# Patient Record
Sex: Female | Born: 1985 | Race: Black or African American | Hispanic: No | Marital: Single | Smoking: Never smoker
Health system: Southern US, Community
[De-identification: ages and names within clinical notes are randomized; demographics above are authoritative.]

## PROBLEM LIST (undated history)

## (undated) DIAGNOSIS — B019 Varicella without complication: Secondary | ICD-10-CM

## (undated) HISTORY — DX: Varicella without complication: B01.9

---

## 2009-03-29 ENCOUNTER — Ambulatory Visit: Payer: Self-pay | Admitting: Family

## 2009-03-29 ENCOUNTER — Inpatient Hospital Stay (HOSPITAL_COMMUNITY): Admission: AD | Admit: 2009-03-29 | Discharge: 2009-03-29 | Payer: Self-pay | Admitting: Obstetrics & Gynecology

## 2009-05-27 ENCOUNTER — Emergency Department (HOSPITAL_COMMUNITY): Admission: EM | Admit: 2009-05-27 | Discharge: 2009-05-28 | Payer: Self-pay | Admitting: Emergency Medicine

## 2010-12-14 LAB — POCT PREGNANCY, URINE: Preg Test, Ur: NEGATIVE

## 2011-12-25 ENCOUNTER — Encounter (HOSPITAL_COMMUNITY): Payer: Self-pay

## 2011-12-25 ENCOUNTER — Emergency Department (INDEPENDENT_AMBULATORY_CARE_PROVIDER_SITE_OTHER): Payer: Medicaid Other

## 2011-12-25 ENCOUNTER — Emergency Department (INDEPENDENT_AMBULATORY_CARE_PROVIDER_SITE_OTHER)
Admission: EM | Admit: 2011-12-25 | Discharge: 2011-12-25 | Disposition: A | Discharge status: Home / Self Care | Payer: Medicaid Other | Source: Home / Self Care | Attending: Family Medicine | Admitting: Family Medicine

## 2011-12-25 DIAGNOSIS — S92919A Unspecified fracture of unspecified toe(s), initial encounter for closed fracture: Secondary | ICD-10-CM

## 2011-12-25 DIAGNOSIS — S92501A Displaced unspecified fracture of right lesser toe(s), initial encounter for closed fracture: Secondary | ICD-10-CM

## 2011-12-25 MED ORDER — HYDROCODONE-ACETAMINOPHEN 5-500 MG PO TABS: 1.0000 | ORAL_TABLET | Freq: Four times a day (QID) | ORAL | Status: AC | PRN

## 2011-12-25 NOTE — ED Notes (Signed)
Pt states she hit her rt foot on bedpost last pm.  C/o pain in rt 3rd and 4th toes and to top of rt foot.

## 2011-12-25 NOTE — Discharge Instructions (Signed)
Ice and pain medicine for swelling as needed, wear shoe until seen by specialist, call next week for appt.

## 2011-12-25 NOTE — ED Provider Notes (Signed)
History     CSN: [id number]  Arrival date & time 12/25/11  1428   First MD Initiated Contact with Patient 12/25/11 1448      Chief Complaint  Patient presents with  . Foot Injury    (Consider location/radiation/quality/duration/timing/severity/associated sxs/prior treatment) Patient is a 26 y.o. female presenting with foot injury. The history is provided by the patient.  Foot Injury  The incident occurred yesterday. The incident occurred at home. The injury mechanism was a direct blow (sruck foot against bed frame , sts worse today). The pain is present in the right foot. The pain is mild. Associated symptoms include loss of motion.    History reviewed. No pertinent past medical history.  History reviewed. No pertinent past surgical history.  No family history on file.  History  Substance Use Topics  . Smoking status: Never Smoker   . Smokeless tobacco: Not on file  . Alcohol Use: Yes    OB History    Grav Para Term Preterm Abortions TAB SAB Ect Mult Living                  Review of Systems  Allergies  Review of patient's allergies indicates no known allergies.  Home Medications   Current Outpatient Rx  Name Route Sig Dispense Refill  . HYDROCODONE-ACETAMINOPHEN 5-500 MG PO TABS Oral Take 1-2 tablets by mouth every 6 (six) hours as needed for pain. 15 tablet 0    LMP 12/23/2011  Physical Exam  Nursing note and vitals reviewed. Constitutional: She appears well-developed and well-nourished.  Musculoskeletal: She exhibits edema and tenderness.  Skin: Skin is warm and dry.    ED Course  Procedures (including critical care time)  Labs Reviewed - No data to display No results found.   1. Fracture of third toe, right, closed       MDM  X-rays reviewed and report per radiologist.         Linna Hoff, MD 12/30/11 2044

## 2012-02-16 ENCOUNTER — Ambulatory Visit (INDEPENDENT_AMBULATORY_CARE_PROVIDER_SITE_OTHER): Payer: BC Managed Care – PPO | Admitting: Family

## 2012-02-16 ENCOUNTER — Encounter: Payer: Self-pay | Admitting: Family

## 2012-02-16 VITALS — BP 110/80 | HR 86 | Temp 98.7°F | Resp 16 | Ht 63.0 in | Wt 120.0 lb

## 2012-02-16 DIAGNOSIS — Z111 Encounter for screening for respiratory tuberculosis: Secondary | ICD-10-CM

## 2012-02-16 DIAGNOSIS — Z23 Encounter for immunization: Secondary | ICD-10-CM

## 2012-02-16 DIAGNOSIS — Z0000 Encounter for general adult medical examination without abnormal findings: Secondary | ICD-10-CM

## 2012-02-16 NOTE — Progress Notes (Signed)
  Subjective:    Patient ID: Laurie Hampton, female    DOB: 1985-10-31, 26 y.o.   MRN: [id number]  HPI  This is a routine physical examination for this healthy  Female. Reviewed all health maintenance protocols including mammography colonoscopy bone density and reviewed appropriate screening labs. Her immunization history was reviewed as well as her current medications and allergies refills of her chronic medications were given and the plan for yearly health maintenance was discussed all orders and referrals were made as appropriate.   Review of Systems  Constitutional: Negative.   HENT: Negative.   Eyes: Negative.   Respiratory: Negative.   Cardiovascular: Negative.   Gastrointestinal: Negative.   Genitourinary: Negative.   Musculoskeletal: Negative.   Skin: Negative.   Neurological: Negative.   Hematological: Negative.   Psychiatric/Behavioral: Negative.    Past Medical History  Diagnosis Date  . Chickenpox     History   Social History  . Marital Status: Single    Spouse Name: N/A    Number of Children: N/A  . Years of Education: N/A   Occupational History  . Not on file.   Social History Main Topics  . Smoking status: Never Smoker   . Smokeless tobacco: Not on file  . Alcohol Use: Yes  . Drug Use: No  . Sexually Active:    Other Topics Concern  . Not on file   Social History Narrative  . No narrative on file    No past surgical history on file.  Family History  Problem Relation Age of Onset  . Hypertension Mother     No Known Allergies  No current outpatient prescriptions on file prior to visit.    BP 110/80  Pulse 86  Temp(Src) 98.7 F (37.1 C) (Oral)  Resp 16  Ht 5\' 3"  (1.6 m)  Wt 120 lb (54.432 kg)  BMI 21.26 kg/m2  LMP 06/09/2013chart    Objective:   Physical Exam  Constitutional: She is oriented to person, place, and time. She appears well-developed and well-nourished.  HENT:  Head: Normocephalic and atraumatic.  Right Ear:  External ear normal.  Left Ear: External ear normal.  Nose: Nose normal.  Mouth/Throat: Oropharynx is clear and moist.  Eyes: Conjunctivae and EOM are normal. Pupils are equal, round, and reactive to light.  Neck: Normal range of motion. Neck supple. No thyromegaly present.  Cardiovascular: Normal rate, regular rhythm and normal heart sounds.   Pulmonary/Chest: Effort normal and breath sounds normal.  Abdominal: Soft. Bowel sounds are normal.  Musculoskeletal: Normal range of motion.  Neurological: She is alert and oriented to person, place, and time. She has normal reflexes.  Skin: Skin is warm and dry.  Psychiatric: She has a normal mood and affect.          Assessment & Plan:  Assessment: Complete physical exam, need for tdap, need for TB skin test  Plan: TB skin test administered and Tdap. Anticipatory guidance appropriate for age to include safe sex practices, self breast exams, healthy diet and exercise. Hepatitis B titer and varicella titer.

## 2012-02-17 LAB — MEASLES/MUMPS/RUBELLA IMMUNITY
Mumps IgG: 1.17 {ISR} — ABNORMAL HIGH
Rubella: 424.3 IU/mL — ABNORMAL HIGH
Rubeola IgG: 3.07 {ISR} — ABNORMAL HIGH

## 2012-05-04 ENCOUNTER — Telehealth: Payer: Self-pay | Admitting: Family

## 2012-05-04 NOTE — Telephone Encounter (Signed)
Pt scheduled  

## 2012-05-04 NOTE — Telephone Encounter (Signed)
Pls advise if pt was to have these done.

## 2012-05-04 NOTE — Telephone Encounter (Signed)
Varicella and MMR drawn but no Hep B. Stop by and we can draw it.

## 2012-05-04 NOTE — Telephone Encounter (Signed)
Pt thought she received a titers for Hep B. Did not see anything in the chart. Please contact

## 2012-05-06 ENCOUNTER — Other Ambulatory Visit: Payer: Self-pay | Admitting: Family

## 2012-05-06 ENCOUNTER — Other Ambulatory Visit (INDEPENDENT_AMBULATORY_CARE_PROVIDER_SITE_OTHER): Payer: BC Managed Care – PPO

## 2012-05-06 DIAGNOSIS — Z0000 Encounter for general adult medical examination without abnormal findings: Secondary | ICD-10-CM

## 2012-05-06 NOTE — Addendum Note (Signed)
Addended by: Mauri Reading on: 05/06/2012 03:37 PM   Modules accepted: Orders

## 2012-05-07 LAB — HEPATITIS B SURFACE ANTIGEN: Hepatitis B Surface Ag: NEGATIVE

## 2012-05-10 ENCOUNTER — Telehealth: Payer: Self-pay

## 2012-05-10 NOTE — Progress Notes (Signed)
Quick Note:  Called and spoke with pt and pt is aware. Pt request labs be sent to Dr. Malen Gauze [phone] ______

## 2012-05-10 NOTE — Telephone Encounter (Signed)
Called and left a message for pt to make aware that hep b antibody titer had to be added to test and as soon as the results are received pt will be contacted and labs will be faxed to Dr. Malen Gauze at [phone].

## 2012-05-18 ENCOUNTER — Ambulatory Visit (INDEPENDENT_AMBULATORY_CARE_PROVIDER_SITE_OTHER): Payer: BC Managed Care – PPO

## 2012-05-18 DIAGNOSIS — Z23 Encounter for immunization: Secondary | ICD-10-CM

## 2012-05-18 DIAGNOSIS — Z0000 Encounter for general adult medical examination without abnormal findings: Secondary | ICD-10-CM

## 2012-12-06 IMAGING — CR DG FOOT COMPLETE 3+V*R*
3 series · 3 of 3 positions shown · non-contrast
Comparison: None.

CLINICAL DATA: 25-year-old female with blunt trauma.  Pain.

RIGHT FOOT COMPLETE - 3+ VIEW

[view not recorded (1 of 3)]
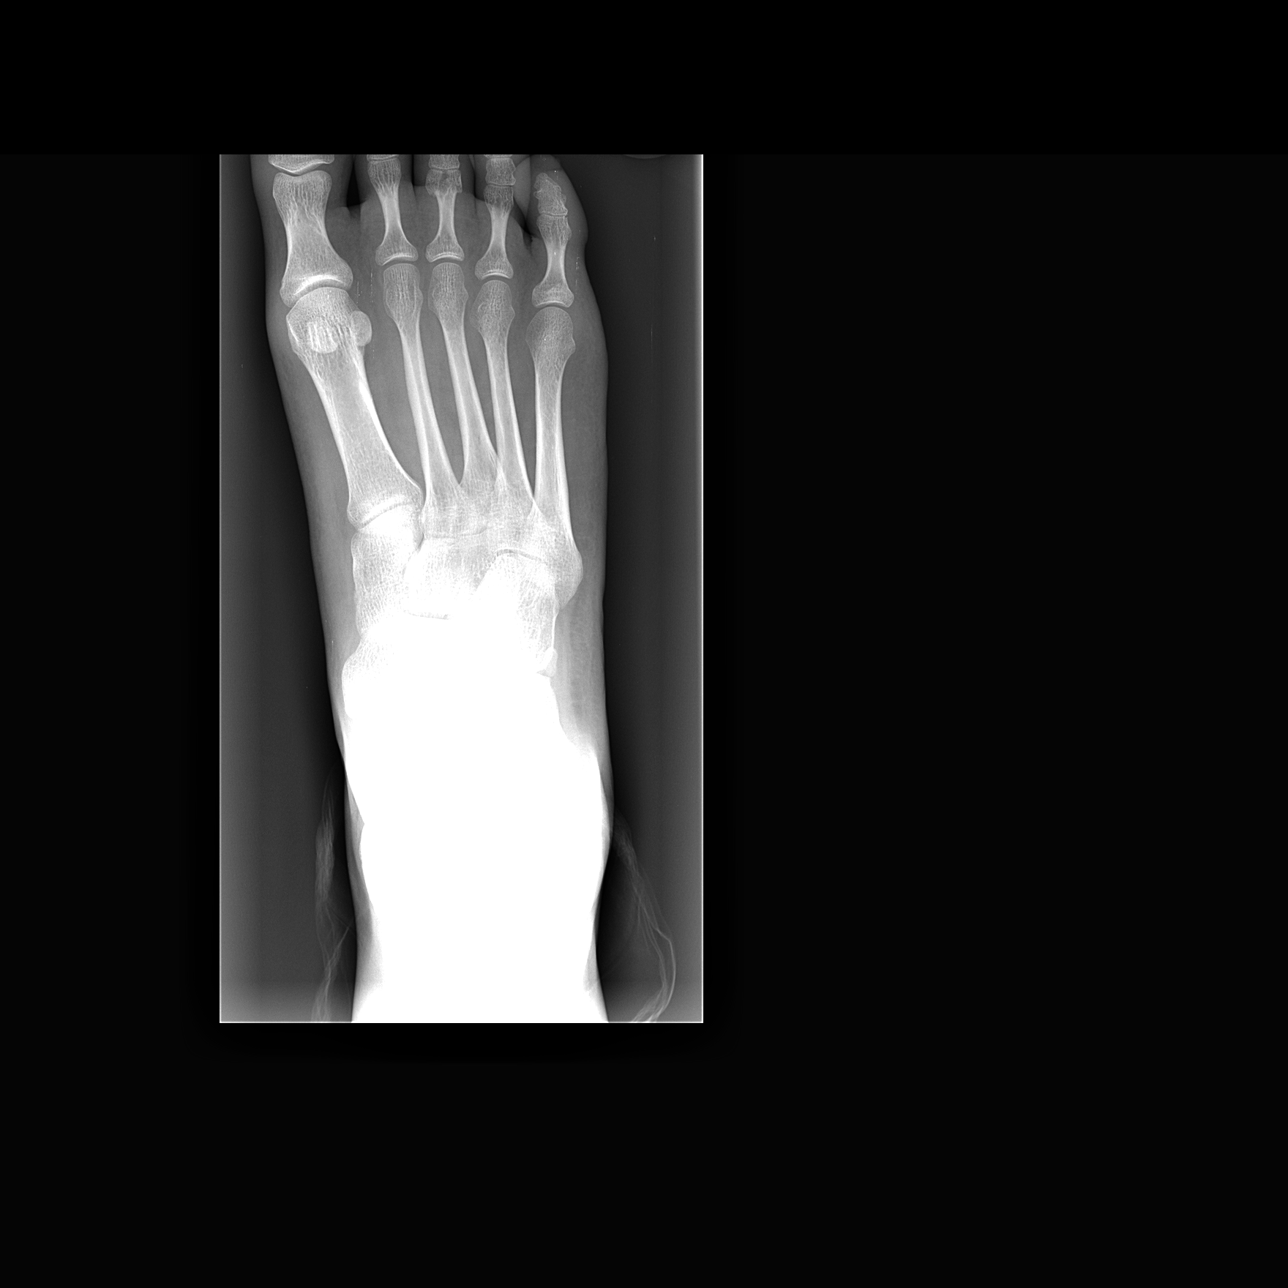

[view not recorded (2 of 3)]
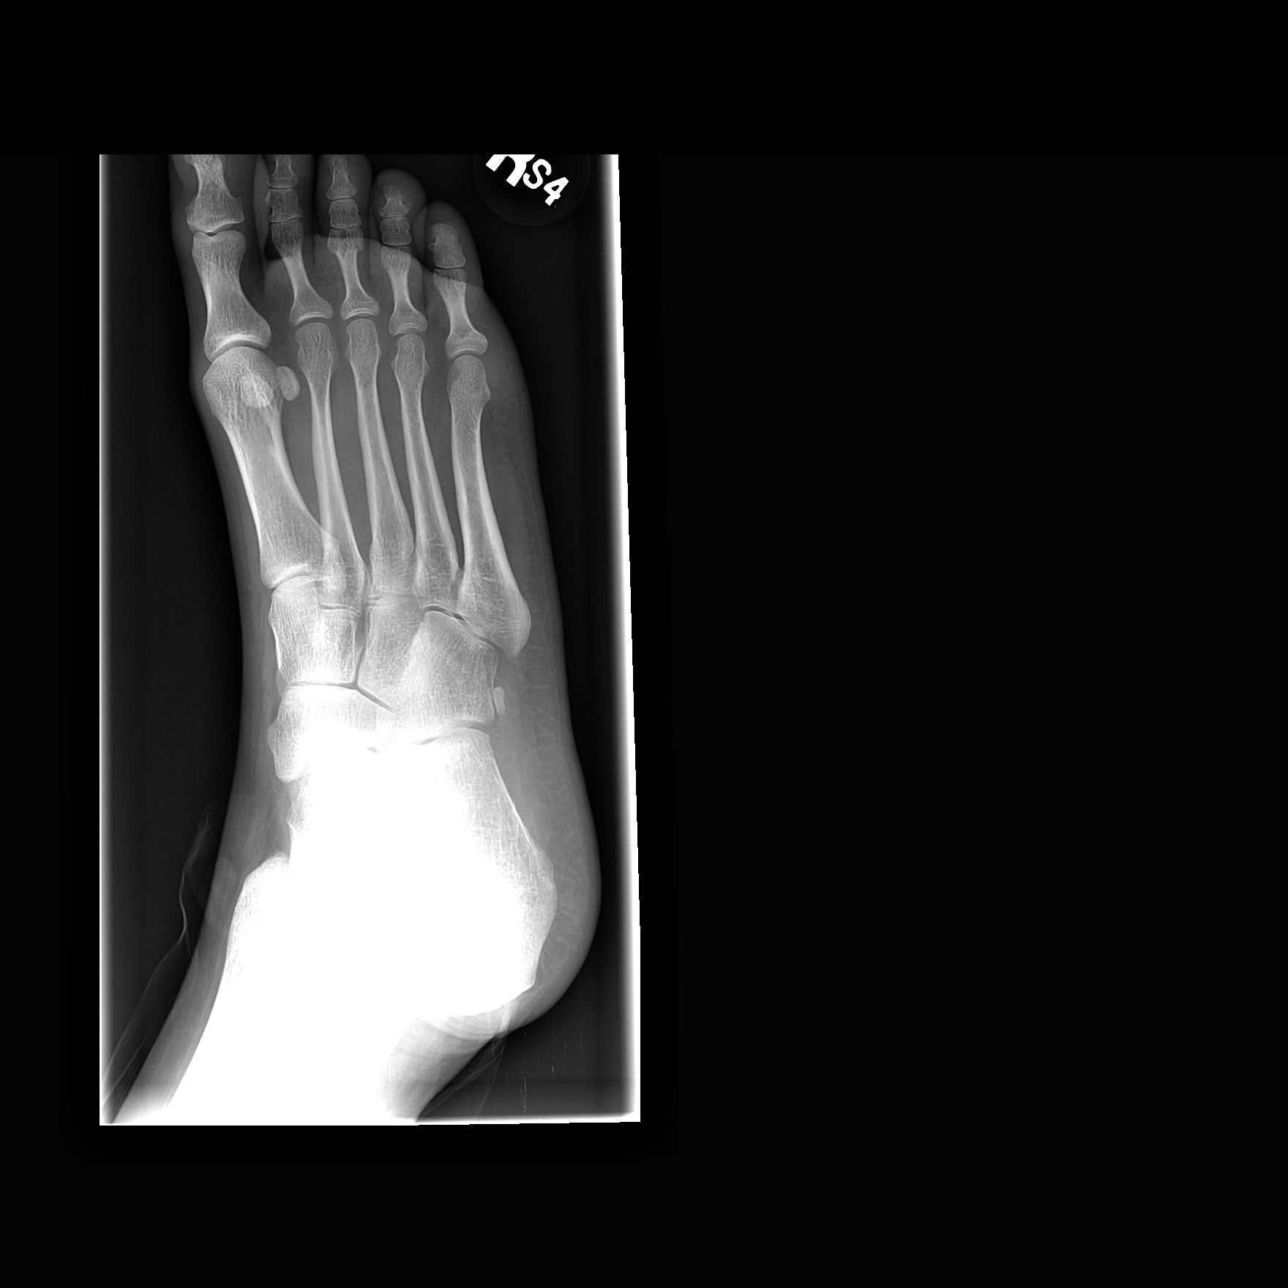

[view not recorded (3 of 3)]
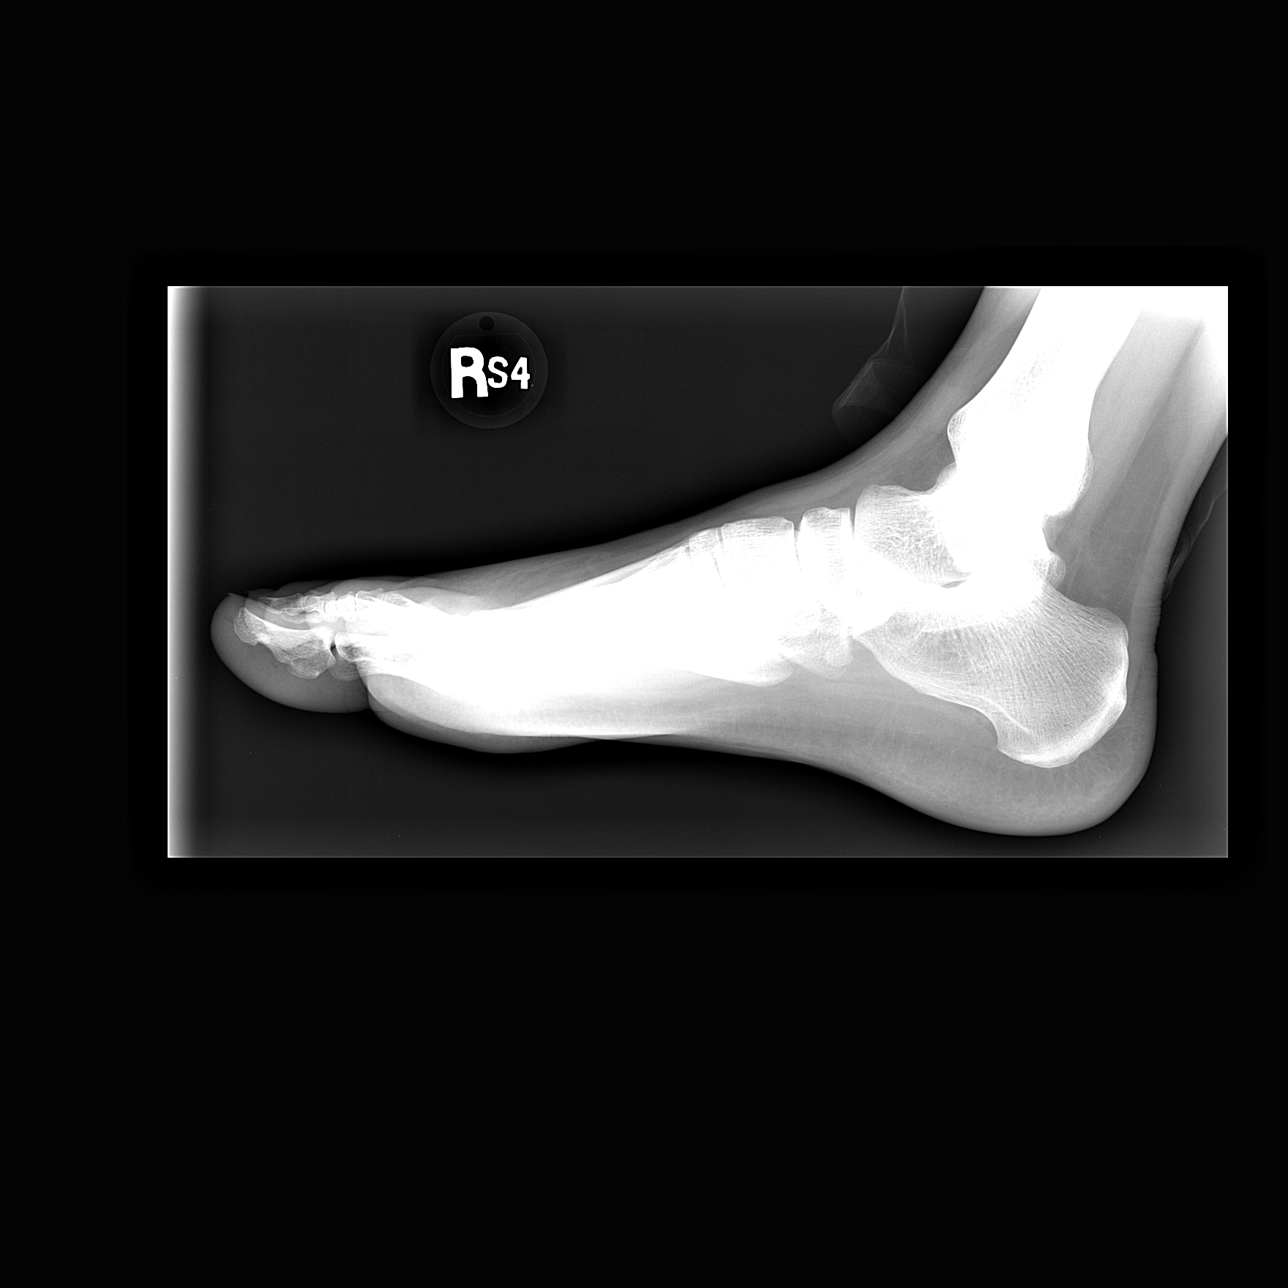

[3 of 3 positions shown; findings below may reference images not displayed]

FINDINGS: Bone mineralization is within normal limits.  Calcaneus
intact.  Joint spaces preserved.  Incidental accessory ossicle
adjacent to the cuboid.

Mildly impacted and comminuted fracture of the right third proximal
phalanx distal aspect.  This does appear to be intra-articular
fracture.  Other phalanges are intact.  No other acute fracture or
dislocation identified in the right foot.
IMPRESSION: Mildly impacted and comminuted intra-articular right third proximal
phalanx fracture.

## 2013-03-13 ENCOUNTER — Ambulatory Visit (INDEPENDENT_AMBULATORY_CARE_PROVIDER_SITE_OTHER): Payer: BC Managed Care – PPO | Admitting: Family

## 2013-03-13 ENCOUNTER — Other Ambulatory Visit (HOSPITAL_COMMUNITY)
Admission: RE | Admit: 2013-03-13 | Discharge: 2013-03-13 | Disposition: A | Discharge status: Home / Self Care | Payer: BC Managed Care – PPO | Source: Ambulatory Visit | Attending: Family | Admitting: Family

## 2013-03-13 ENCOUNTER — Encounter: Payer: Self-pay | Admitting: Family

## 2013-03-13 VITALS — BP 100/58 | HR 100 | Ht 62.75 in | Wt 123.0 lb

## 2013-03-13 DIAGNOSIS — Z113 Encounter for screening for infections with a predominantly sexual mode of transmission: Secondary | ICD-10-CM

## 2013-03-13 DIAGNOSIS — N760 Acute vaginitis: Secondary | ICD-10-CM | POA: Insufficient documentation

## 2013-03-13 DIAGNOSIS — Z01419 Encounter for gynecological examination (general) (routine) without abnormal findings: Secondary | ICD-10-CM | POA: Insufficient documentation

## 2013-03-13 DIAGNOSIS — Z0000 Encounter for general adult medical examination without abnormal findings: Secondary | ICD-10-CM

## 2013-03-13 LAB — POCT URINALYSIS DIPSTICK
Bilirubin, UA: NEGATIVE
Glucose, UA: NEGATIVE
Leukocytes, UA: NEGATIVE
Nitrite, UA: NEGATIVE
pH, UA: 7.5

## 2013-03-13 NOTE — Patient Instructions (Signed)
Bacterial Vaginosis Bacterial vaginosis (BV) is a vaginal infection where the normal balance of bacteria in the vagina is disrupted. The normal balance is then replaced by an overgrowth of certain bacteria. There are several different kinds of bacteria that can cause BV. BV is the most common vaginal infection in women of childbearing age. CAUSES   The cause of BV is not fully understood. BV develops when there is an increase or imbalance of harmful bacteria.  Some activities or behaviors can upset the normal balance of bacteria in the vagina and put women at increased risk including:  Having a new sex partner or multiple sex partners.  Douching.  Using an intrauterine device (IUD) for contraception.  It is not clear what role sexual activity plays in the development of BV. However, women that have never had sexual intercourse are rarely infected with BV. Women do not get BV from toilet seats, bedding, swimming pools or from touching objects around them.  SYMPTOMS   Grey vaginal discharge.  A fish-like odor with discharge, especially after sexual intercourse.  Itching or burning of the vagina and vulva.  Burning or pain with urination.  Some women have no signs or symptoms at all. DIAGNOSIS  Your caregiver must examine the vagina for signs of BV. Your caregiver will perform lab tests and look at the sample of vaginal fluid through a microscope. They will look for bacteria and abnormal cells (clue cells), a pH test higher than 4.5, and a positive amine test all associated with BV.  RISKS AND COMPLICATIONS   Pelvic inflammatory disease (PID).  Infections following gynecology surgery.  Developing HIV.  Developing herpes virus. TREATMENT  Sometimes BV will clear up without treatment. However, all women with symptoms of BV should be treated to avoid complications, especially if gynecology surgery is planned. Female partners generally do not need to be treated. However, BV may spread  between female sex partners so treatment is helpful in preventing a recurrence of BV.   BV may be treated with antibiotics. The antibiotics come in either pill or vaginal cream forms. Either can be used with nonpregnant or pregnant women, but the recommended dosages differ. These antibiotics are not harmful to the baby.  BV can recur after treatment. If this happens, a second round of antibiotics will often be prescribed.  Treatment is important for pregnant women. If not treated, BV can cause a premature delivery, especially for a pregnant woman who had a premature birth in the past. All pregnant women who have symptoms of BV should be checked and treated.  For chronic reoccurrence of BV, treatment with a type of prescribed gel vaginally twice a week is helpful. HOME CARE INSTRUCTIONS   Finish all medication as directed by your caregiver.  Do not have sex until treatment is completed.  Tell your sexual partner that you have a vaginal infection. They should see their caregiver and be treated if they have problems, such as a mild rash or itching.  Practice safe sex. Use condoms. Only have 1 sex partner. PREVENTION  Basic prevention steps can help reduce the risk of upsetting the natural balance of bacteria in the vagina and developing BV:  Do not have sexual intercourse (be abstinent).  Do not douche.  Use all of the medicine prescribed for treatment of BV, even if the signs and symptoms go away.  Tell your sex partner if you have BV. That way, they can be treated, if needed, to prevent reoccurrence. SEEK MEDICAL CARE IF:     Your symptoms are not improving after 3 days of treatment.  You have increased discharge, pain, or fever. MAKE SURE YOU:   Understand these instructions.  Will watch your condition.  Will get help right away if you are not doing well or get worse. FOR MORE INFORMATION  Division of STD Prevention (DSTDP), Centers for Disease Control and Prevention:  [employer] American Social Health Association (ASHA): www.ashastd.org  Document Released: 08/24/2005 Document Revised: 11/16/2011 Document Reviewed: 02/14/2009 [facility] Patient Information 2014 [place], [place].   Breast Self-Examination You should begin examining your breasts at age 61 even though the risk for breast cancer is low in this age group. It is important to become familiar with how your breasts look and feel. This is true for pregnant women, nursing mothers, women in menopause and women who have breast implants.  Women should examine their breasts once a month to look for changes and lumps. By doing monthly breast exams, you get to know how your breasts feel and how they can change from month to month. This allows you to pick up changes early. It can also offer you some reassurance that your breast health is good. This exam only takes minutes. Most breast lumps are not caused by cancer. If you find a lump, a special x-ray called a mammogram, or other tests may be needed to determine what is wrong.  Some of the signs that a breast lump is caused by cancer include:  Dimpling of the skin or changes in the shape of the breast or nipple.   A dark-colored or bloody discharge from the nipple.   Swollen lymph glands around the breast or in the armpit.   Redness of the breast or nipple.   Scaly nipple or skin on the breast.   Pain or swelling of the breast.  SELF-EXAM There are a few points to follow when doing a thorough breast exam. The best time to examine your breasts is 5 to 7 days after the menstrual period is over. During menstruation, the breasts are lumpier, and it may be more difficult to pick up changes. If you do not menstruate, have reached menopause or had a hysterectomy, examine your breasts the first day of every month. After three to four months, you will become more familiar with the variations of your breasts and more comfortable with the exam.  Perform your breast  exam monthly. Keep a written record with breast changes or normal findings for each breast. This makes it easier to be sure of changes and to not solely depend on memory for size, tenderness, or location. Try to do the exam at the same time each month, and write down where you are in your menstrual cycle if you are still menstruating.   Look at your breasts. Stand in front of a mirror with your hands clasped behind your head. Tighten your chest muscles and look for asymmetry. This means a difference in shape or contour from one breast to the other, such as puckers, dips or bumps. Look also for skin changes.   Lean forward with your hands on your hips. Again, look for symmetry and skin changes.   While showering, soap the breasts, and carefully feel the breasts with fingertips while holding the arm (on the side of the breast being examined) over the head. Do this with each breast carefully feeling for lumps or changes. Typically, a circular motion with moderate fingertip pressure should be used.   Repeat this exam while lying on your back, again  with your arm behind your head and a pillow under your shoulders. Again, use your fingertips to examine both breasts, feeling for lumps and thickening. Begin at 1 o'clock and go clockwise around the whole breast.   At the end of your exam, gently squeeze each nipple to see if there is any drainage. Look for nipple changes, dimpling or redness.   Lastly, examine the upper chest and clavicle areas and in your armpits.  It is not necessary to become alarmed if you find a breast lump. Most of them are not cancerous. However, it is necessary to see your caregiver if a lump is found in order to have it looked at. Document Released: 10/01/2004 Document Revised: 05/06/2011 Document Reviewed: 12/11/2008 [facility] Patient Information 2012 [place], [place].

## 2013-03-14 ENCOUNTER — Encounter: Payer: Self-pay | Admitting: Family

## 2013-03-14 LAB — HIV ANTIBODY (ROUTINE TESTING W REFLEX): HIV: NONREACTIVE

## 2013-03-14 LAB — CBC WITH DIFFERENTIAL/PLATELET
Basophils Absolute: 0 K/uL (ref 0.0–0.1)
Basophils Relative: 0.5 % (ref 0.0–3.0)
Eosinophils Absolute: 0.1 K/uL (ref 0.0–0.7)
Eosinophils Relative: 1.8 % (ref 0.0–5.0)
HCT: 40.5 % (ref 36.0–46.0)
Hemoglobin: 13.4 g/dL (ref 12.0–15.0)
Lymphocytes Relative: 36.3 % (ref 12.0–46.0)
Lymphs Abs: 2.3 K/uL (ref 0.7–4.0)
MCHC: 33.2 g/dL (ref 30.0–36.0)
MCV: 86.5 fl (ref 78.0–100.0)
Monocytes Absolute: 0.7 K/uL (ref 0.1–1.0)
Monocytes Relative: 10.4 % (ref 3.0–12.0)
Neutro Abs: 3.2 K/uL (ref 1.4–7.7)
Neutrophils Relative %: 51 % (ref 43.0–77.0)
Platelets: 175 K/uL (ref 150.0–400.0)
RBC: 4.68 Mil/uL (ref 3.87–5.11)
RDW: 13.6 % (ref 11.5–14.6)
WBC: 6.2 K/uL (ref 4.5–10.5)

## 2013-03-14 LAB — SYPHILIS: RPR W/REFLEX TO RPR TITER AND TREPONEMAL ANTIBODIES, TRADITIONAL SCREENING AND DIAGNOSIS ALGORITHM

## 2013-03-14 LAB — COMPREHENSIVE METABOLIC PANEL WITH GFR
ALT: 18 U/L (ref 0–35)
AST: 19 U/L (ref 0–37)
Albumin: 4.5 g/dL (ref 3.5–5.2)
Alkaline Phosphatase: 40 U/L (ref 39–117)
BUN: 10 mg/dL (ref 6–23)
CO2: 31 meq/L (ref 19–32)
Calcium: 9.3 mg/dL (ref 8.4–10.5)
Chloride: 106 meq/L (ref 96–112)
Creatinine, Ser: 0.9 mg/dL (ref 0.4–1.2)
GFR: 102.57 mL/min
Glucose, Bld: 78 mg/dL (ref 70–99)
Potassium: 4.6 meq/L (ref 3.5–5.1)
Sodium: 141 meq/L (ref 135–145)
Total Bilirubin: 0.9 mg/dL (ref 0.3–1.2)
Total Protein: 7.6 g/dL (ref 6.0–8.3)

## 2013-03-14 MED ORDER — METRONIDAZOLE 500 MG PO TABS: 500.0000 mg | ORAL_TABLET | Freq: Two times a day (BID) | ORAL | Status: AC

## 2013-03-14 MED ORDER — LEVONORGESTREL-ETHINYL ESTRAD 0.15-30 MG-MCG PO TABS: 1.0000 | ORAL_TABLET | Freq: Every day | ORAL | Status: AC

## 2013-03-14 NOTE — Progress Notes (Signed)
  Subjective:    Patient ID: Laurie Hampton, female    DOB: 13-Sep-1985, 27 y.o.   MRN: [id number]  HPI 27 year old African American female, nonsmoker is in for complete physical exam with Pap and pelvic. She has concerns of vaginal discharge that she describes it as fishy at times. Reports it's white. No bleeding in between her menstrual cycles. She sexually active with one female partner. Will like to be screened for sexually transmitted diseases. She would also like to consider birth control pills.   Review of Systems  Constitutional: Negative.   HENT: Negative.   Eyes: Negative.   Respiratory: Negative.   Cardiovascular: Negative.   Gastrointestinal: Negative.   Endocrine: Negative.   Genitourinary: Positive for vaginal discharge.  Musculoskeletal: Negative.   Skin: Negative.   Allergic/Immunologic: Negative.   Neurological: Negative.   Hematological: Negative.   Psychiatric/Behavioral: Negative.    Past Medical History  Diagnosis Date  . Chickenpox     History   Social History  . Marital Status: Single    Spouse Name: N/A    Number of Children: N/A  . Years of Education: N/A   Occupational History  . Not on file.   Social History Main Topics  . Smoking status: Never Smoker   . Smokeless tobacco: Not on file  . Alcohol Use: Yes  . Drug Use: No  . Sexually Active:    Other Topics Concern  . Not on file   Social History Narrative  . No narrative on file    History reviewed. No pertinent past surgical history.  Family History  Problem Relation Age of Onset  . Hypertension Mother     No Known Allergies  No current outpatient prescriptions on file prior to visit.   No current facility-administered medications on file prior to visit.    BP 100/58  Pulse 100  Ht 5' 2.75" (1.594 m)  Wt 123 lb (55.792 kg)  BMI 21.96 kg/m2  SpO2 97%chart    Objective:   Physical Exam  Constitutional: She is oriented to person, place, and time. She appears  well-developed and well-nourished.  HENT:  Head: Normocephalic.  Right Ear: External ear normal.  Left Ear: External ear normal.  Nose: Nose normal.  Mouth/Throat: Oropharynx is clear and moist.  Eyes: Conjunctivae are normal. Pupils are equal, round, and reactive to light.  Neck: Normal range of motion. Neck supple.  Cardiovascular: Normal rate, regular rhythm and normal heart sounds.   Pulmonary/Chest: Effort normal and breath sounds normal.  Abdominal: Soft. Bowel sounds are normal.  Genitourinary: Uterus normal. Vaginal discharge found.  White frothy vaginal discharge. Whiff test positive  Musculoskeletal: Normal range of motion.  Neurological: She is alert and oriented to person, place, and time. She has normal reflexes. She displays normal reflexes. No cranial nerve deficit. Coordination normal.  Skin: Skin is warm and dry.  Psychiatric: She has a normal mood and affect.          Assessment & Plan:  Assessment: 1. Complete physical exam 2. Counseling on birth control 3. Vaginal discharge 4. Bacterial vaginosis   Plan: Flagyl 500 mg one tablet twice a day x7 days. Nordette birth control pills once daily. Safe sex practices. Lab sent to include UA, CBC, CMP, HIV, RPR, HSV 1 and 2 were noted patient pending results. Encouraged healthy diet, exercise, monthly self breast exams. We'll follow up patient in one year and sooner as needed.

## 2014-07-09 ENCOUNTER — Encounter: Payer: Self-pay | Admitting: Family

## 2018-11-19 ENCOUNTER — Ambulatory Visit (INDEPENDENT_AMBULATORY_CARE_PROVIDER_SITE_OTHER): Payer: Managed Care, Other (non HMO)

## 2018-11-19 ENCOUNTER — Encounter (HOSPITAL_COMMUNITY): Payer: Self-pay

## 2018-11-19 ENCOUNTER — Ambulatory Visit (HOSPITAL_COMMUNITY)
Admission: EM | Admit: 2018-11-19 | Discharge: 2018-11-19 | Disposition: A | Discharge status: Home / Self Care | Payer: Managed Care, Other (non HMO) | Attending: Emergency Medicine | Admitting: Emergency Medicine

## 2018-11-19 DIAGNOSIS — W2203XA Walked into furniture, initial encounter: Secondary | ICD-10-CM | POA: Diagnosis not present

## 2018-11-19 DIAGNOSIS — S92514A Nondisplaced fracture of proximal phalanx of right lesser toe(s), initial encounter for closed fracture: Secondary | ICD-10-CM | POA: Diagnosis not present

## 2018-11-19 DIAGNOSIS — S99921A Unspecified injury of right foot, initial encounter: Secondary | ICD-10-CM

## 2018-11-19 MED ORDER — NAPROXEN 500 MG PO TABS: 500.0000 mg | ORAL_TABLET | Freq: Two times a day (BID) | ORAL | 0 refills | Status: AC

## 2018-11-19 NOTE — ED Provider Notes (Signed)
[facility]    CSN: [id number] Arrival date & time: 11/19/18  1734     History   Chief Complaint Chief Complaint  Patient presents with  . Foot Injury    Right foot / toe     HPI Laurie Hampton is a 34 y.o. female.   Laurie Hampton presents with complaints of right foot ring toe with pain after she accidentally kicked the corner of her couch today at approximately 1p today. Pain with weight bearing and with touch. Some numbness sensation to the toe. Hasn't taken any medications for symptoms. Pain 7/10. States she has broken a toe on this foot in the past. No broken skin, laceration or nail injury. Without contributing medical history.      ROS per HPI, negative if not otherwise mentioned.      Past Medical History:  Diagnosis Date  . Chickenpox     There are no active problems to display for this patient.   History reviewed. No pertinent surgical history.  OB History    Gravida  1   Para  1   Term      Preterm      AB      Living        SAB      TAB      Ectopic      Multiple      Live Births               Home Medications    Prior to Admission medications   Medication Sig Start Date End Date Taking? Authorizing Provider  levonorgestrel-ethinyl estradiol (NORDETTE) 0.15-30 MG-MCG tablet Take 1 tablet by mouth daily. 03/14/13   Eulis Foster, FNP  metroNIDAZOLE (FLAGYL) 500 MG tablet Take 1 tablet (500 mg total) by mouth 2 (two) times daily. 03/14/13   Eulis Foster, FNP  naproxen (NAPROSYN) 500 MG tablet Take 1 tablet (500 mg total) by mouth 2 (two) times daily. 11/19/18   Georgetta Haber, NP    Family History Family History  Problem Relation Age of Onset  . Hypertension Mother     Social History Social History   Tobacco Use  . Smoking status: Never Smoker  Substance Use Topics  . Alcohol use: Yes  . Drug use: No     Allergies   Patient has no known allergies.   Review of Systems Review of Systems    Physical Exam Triage Vital Signs ED Triage Vitals [11/19/18 1828]  Enc Vitals Group     BP 137/81     Pulse Rate 88     Resp 16     Temp 98.1 F (36.7 C)     Temp Source Oral     SpO2 100 %     Weight      Height      Head Circumference      Peak Flow      Pain Score 7     Pain Loc      Pain Edu?      Excl. in GC?    No data found.  Updated Vital Signs BP 137/81 (BP Location: Right Arm)   Pulse 88   Temp 98.1 F (36.7 C) (Oral)   Resp 16   LMP 11/11/2018 (Exact Date)   SpO2 100%    Physical Exam Constitutional:      General: She is not in acute distress.    Appearance: She is well-developed.  Cardiovascular:  Rate and Rhythm: Normal rate and regular rhythm.     Heart sounds: Normal heart sounds.  Pulmonary:     Effort: Pulmonary effort is normal.     Breath sounds: Normal breath sounds.  Musculoskeletal:     Right ankle: Normal.     Right foot: Normal range of motion and normal capillary refill. Tenderness, bony tenderness and swelling present. No crepitus, deformity or laceration.     Comments: Right ring toe with tenderness at distal and proximal phalanx; no tenderness at MTP joint; cap refill < 2 seconds , gross sensation intact; limited ROM to DIP joint to this toe due to pain; swelling and bruising noted; skin intact; strong dorsal pedal pulse   Skin:    General: Skin is warm and dry.  Neurological:     Mental Status: She is alert and oriented to person, place, and time.      UC Treatments / Results  Labs (all labs ordered are listed, but only abnormal results are displayed) Labs Reviewed - No data to display  EKG None  Radiology Dg Foot Complete Right  Result Date: 11/19/2018 CLINICAL DATA:  Kicked object accidentally.  Pain EXAM: RIGHT FOOT COMPLETE - 3+ VIEW COMPARISON:  None. FINDINGS: There is a fracture noted at the base of the right 4th toe proximal phalanx, minimal displacement. No subluxation or dislocation. No additional fracture.  IMPRESSION: Fracture at the base of the right 4th toe proximal phalanx. Electronically Signed   By: Charlett Nose M.D.   On: 11/19/2018 19:08    Procedures Procedures (including critical care time)  Medications Ordered in UC Medications - No data to display  Initial Impression / Assessment and Plan / UC Course  I have reviewed the triage vital signs and the nursing notes.  Pertinent labs & imaging results that were available during my care of the patient were reviewed by me and considered in my medical decision making (see chart for details).     Fracture identified. Post op shoe provided and educated on pain management. Patient verbalized understanding and agreeable to plan.  Ambulatory out of clinic without difficulty.    Final Clinical Impressions(s) / UC Diagnoses   Final diagnoses:  Closed nondisplaced fracture of proximal phalanx of lesser toe of right foot, initial encounter     Discharge Instructions     Ice, elevation naproxen for pain control.  Use of shoe we have provided for you until pain has resolved.  Follow up with orthopedics as needed for persistent or worsening of symptoms.     ED Prescriptions    Medication Sig Dispense Auth. Provider   naproxen (NAPROSYN) 500 MG tablet Take 1 tablet (500 mg total) by mouth 2 (two) times daily. 30 tablet Georgetta Haber, NP     Controlled Substance Prescriptions [place] Controlled Substance Registry consulted? Not Applicable   Georgetta Haber, NP 11/19/18 [phone]

## 2018-11-19 NOTE — Discharge Instructions (Signed)
Ice, elevation naproxen for pain control.  Use of shoe we have provided for you until pain has resolved.  Follow up with orthopedics as needed for persistent or worsening of symptoms.

## 2018-11-19 NOTE — ED Triage Notes (Signed)
Pt present right foot/ toe pain, pt states she hit her toe on the dresser this am.  Pt has attempted to move the toe that is next to her pink but it hurts.

## 2019-07-27 ENCOUNTER — Other Ambulatory Visit: Payer: Self-pay

## 2019-07-27 DIAGNOSIS — Z20822 Contact with and (suspected) exposure to covid-19: Secondary | ICD-10-CM

## 2019-07-29 LAB — NOVEL CORONAVIRUS, NAA: SARS-CoV-2, NAA: NOT DETECTED

## 2019-11-01 IMAGING — DX RIGHT FOOT COMPLETE - 3+ VIEW
3 series · 3 of 3 positions shown · non-contrast
Comparison: None.

CLINICAL DATA: Kicked object accidentally.  Pain

EXAM:
RIGHT FOOT COMPLETE - 3+ VIEW

[foot ap]
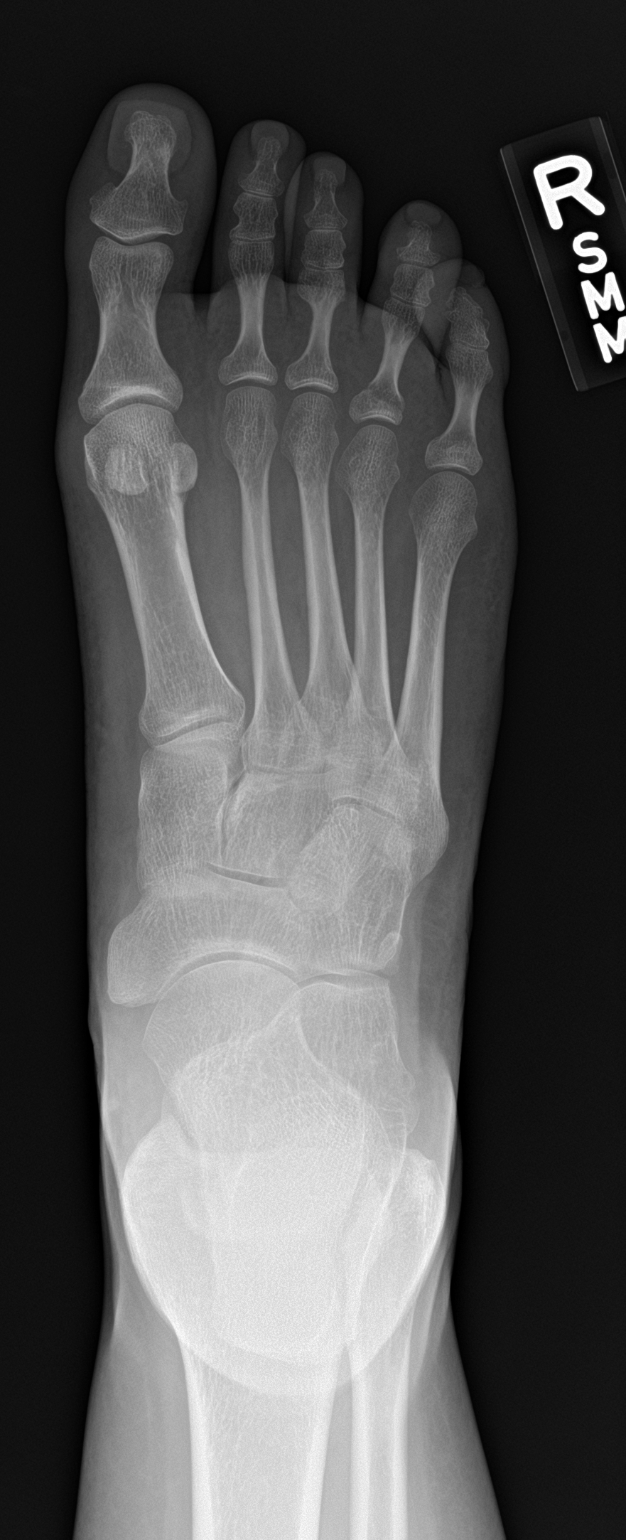

[foot obl]
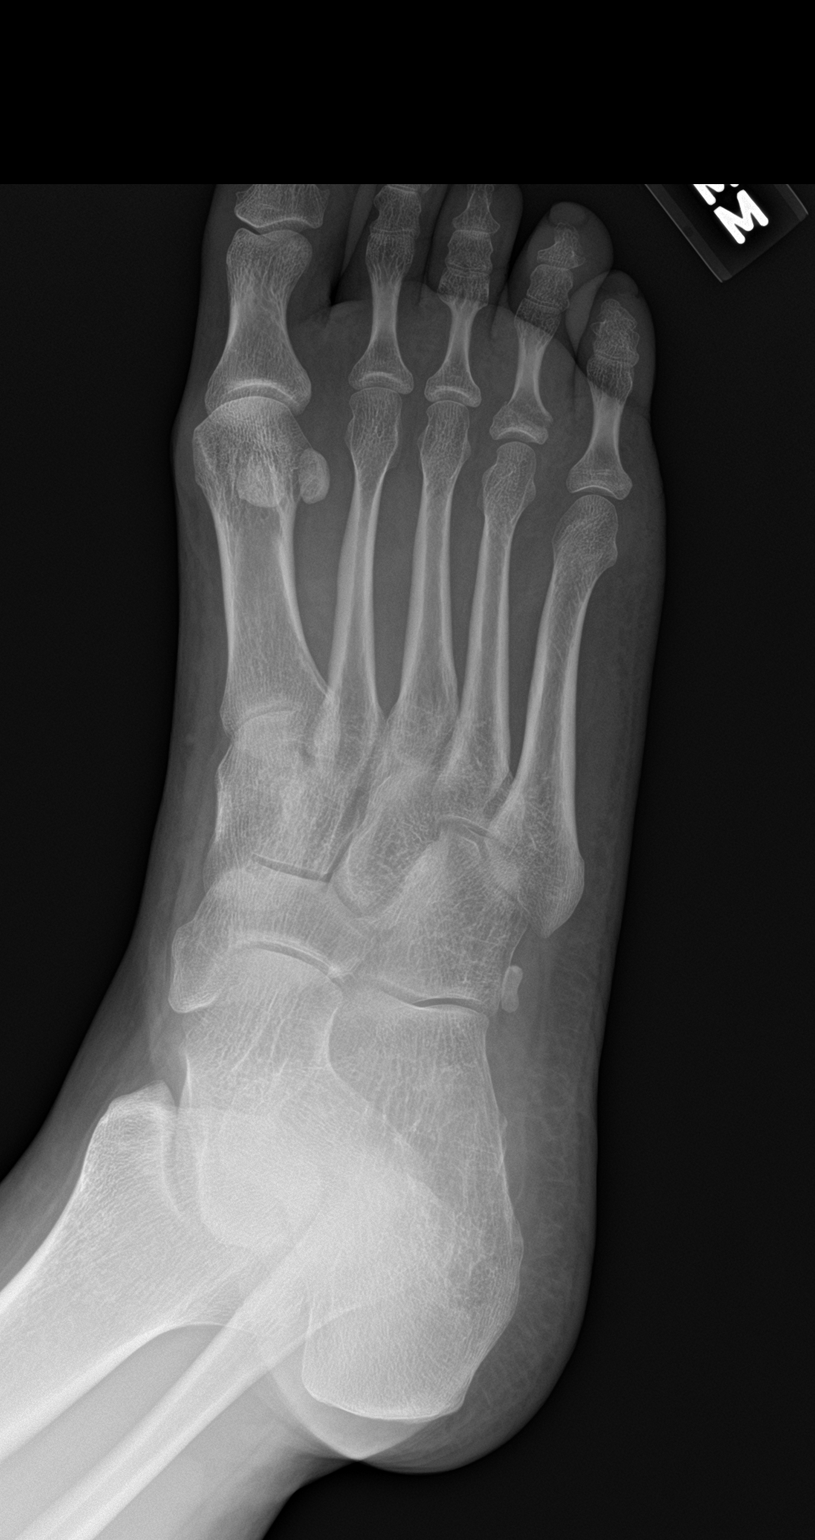

[foot lat]
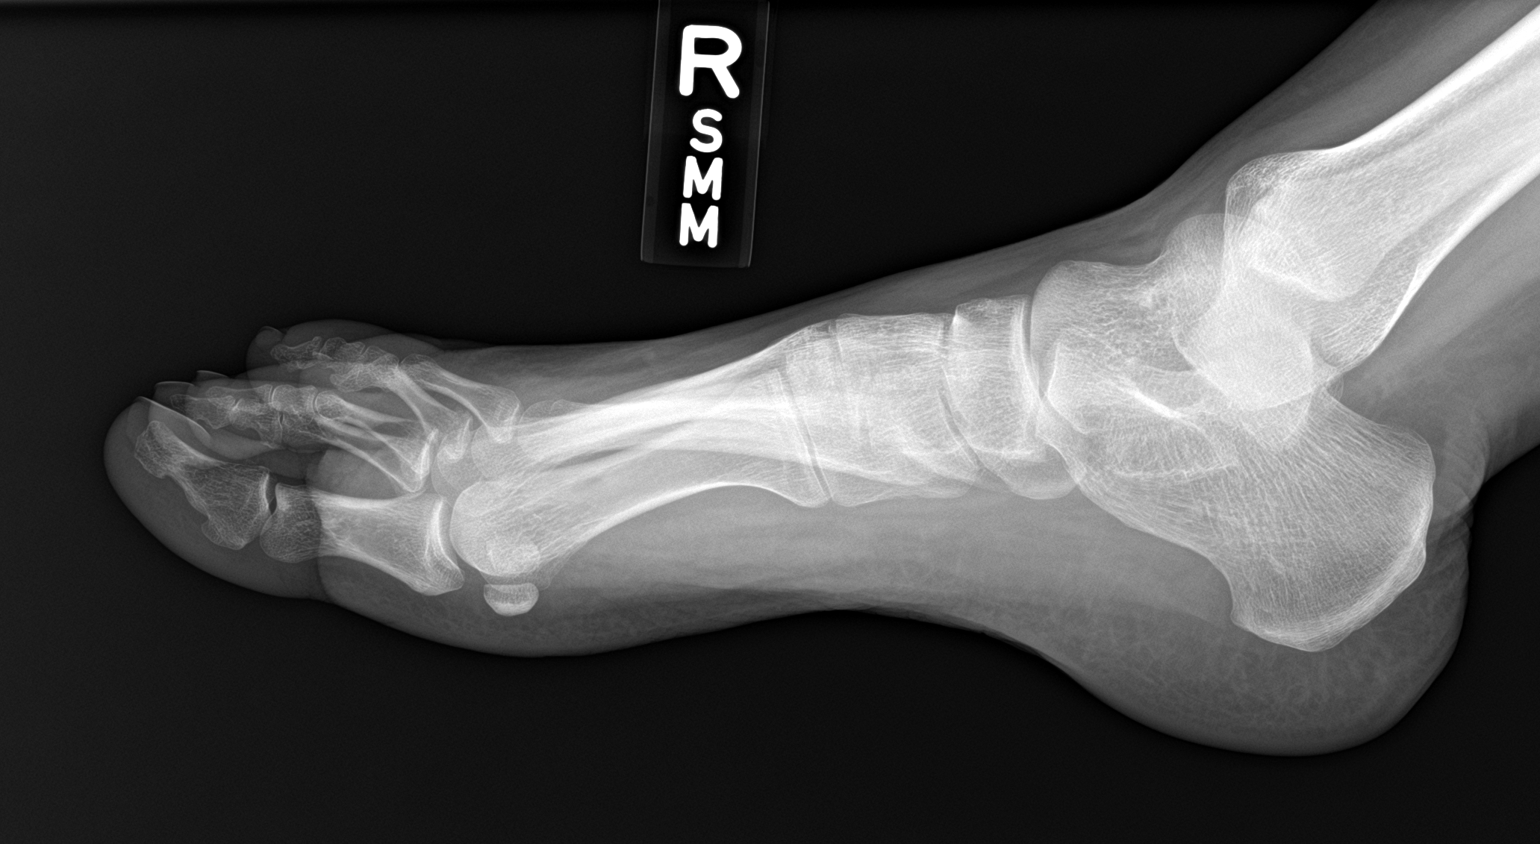

[3 of 3 positions shown; findings below may reference images not displayed]

FINDINGS: There is a fracture noted at the base of the right 4th toe proximal
phalanx, minimal displacement. No subluxation or dislocation. No
additional fracture.
IMPRESSION: Fracture at the base of the right 4th toe proximal phalanx.
